# Patient Record
Sex: Female | Born: 1950 | Hispanic: No | Marital: Married | Smoking: Never smoker
Health system: Southern US, Community
[De-identification: ages and names within clinical notes are randomized; demographics above are authoritative.]

---

## 2002-06-17 ENCOUNTER — Encounter: Payer: Self-pay | Admitting: Internal Medicine

## 2002-06-17 ENCOUNTER — Ambulatory Visit (HOSPITAL_COMMUNITY): Admission: RE | Admit: 2002-06-17 | Discharge: 2002-06-17 | Payer: Self-pay | Admitting: Internal Medicine

## 2002-07-28 ENCOUNTER — Other Ambulatory Visit: Admission: RE | Admit: 2002-07-28 | Discharge: 2002-07-28 | Payer: Self-pay | Admitting: Family Medicine

## 2002-07-28 ENCOUNTER — Encounter: Admission: RE | Admit: 2002-07-28 | Discharge: 2002-07-28 | Payer: Self-pay | Admitting: *Deleted

## 2002-10-04 ENCOUNTER — Encounter: Payer: Self-pay | Admitting: Internal Medicine

## 2002-10-04 ENCOUNTER — Ambulatory Visit (HOSPITAL_COMMUNITY): Admission: RE | Admit: 2002-10-04 | Discharge: 2002-10-04 | Payer: Self-pay | Admitting: Internal Medicine

## 2003-03-02 ENCOUNTER — Emergency Department (HOSPITAL_COMMUNITY): Admission: EM | Admit: 2003-03-02 | Discharge: 2003-03-02 | Payer: Self-pay

## 2014-03-29 LAB — PROCEDURE REPORT - SCANNED: Pap: NEGATIVE

## 2014-10-14 ENCOUNTER — Emergency Department (HOSPITAL_COMMUNITY)
Admission: EM | Admit: 2014-10-14 | Discharge: 2014-10-14 | Disposition: A | Discharge status: Home / Self Care | Payer: Worker's Compensation | Attending: Emergency Medicine | Admitting: Emergency Medicine

## 2014-10-14 ENCOUNTER — Encounter (HOSPITAL_COMMUNITY): Payer: Self-pay

## 2014-10-14 DIAGNOSIS — Y998 Other external cause status: Secondary | ICD-10-CM | POA: Insufficient documentation

## 2014-10-14 DIAGNOSIS — S61219A Laceration without foreign body of unspecified finger without damage to nail, initial encounter: Secondary | ICD-10-CM

## 2014-10-14 DIAGNOSIS — Z23 Encounter for immunization: Secondary | ICD-10-CM | POA: Diagnosis not present

## 2014-10-14 DIAGNOSIS — Y92090 Kitchen in other non-institutional residence as the place of occurrence of the external cause: Secondary | ICD-10-CM | POA: Insufficient documentation

## 2014-10-14 DIAGNOSIS — Y9389 Activity, other specified: Secondary | ICD-10-CM | POA: Diagnosis not present

## 2014-10-14 DIAGNOSIS — W260XXA Contact with knife, initial encounter: Secondary | ICD-10-CM | POA: Insufficient documentation

## 2014-10-14 DIAGNOSIS — S61211A Laceration without foreign body of left index finger without damage to nail, initial encounter: Secondary | ICD-10-CM | POA: Diagnosis not present

## 2014-10-14 MED ORDER — ACETAMINOPHEN 325 MG PO TABS
650.0000 mg | ORAL_TABLET | Freq: Once | ORAL | Status: AC
  Administered 2014-10-14: 650 mg via ORAL
  Filled 2014-10-14: qty 2

## 2014-10-14 MED ORDER — TETANUS-DIPHTH-ACELL PERTUSSIS 5-2.5-18.5 LF-MCG/0.5 IM SUSP
0.5000 mL | Freq: Once | INTRAMUSCULAR | Status: AC
  Administered 2014-10-14: 0.5 mL via INTRAMUSCULAR
  Filled 2014-10-14: qty 0.5

## 2014-10-14 MED ORDER — "THROMBI-PAD 3""X3"" EX PADS"
1.0000 | MEDICATED_PAD | Freq: Once | CUTANEOUS | Status: DC
  Filled 2014-10-14: qty 1

## 2014-10-14 NOTE — Discharge Instructions (Signed)

## 2014-10-14 NOTE — ED Notes (Signed)
Patient cut the tip of left 1st finger with a knife. Patient bleeding upon arrival to the ED.

## 2014-10-14 NOTE — ED Provider Notes (Signed)
CSN: [id number]     Arrival date & time 10/14/14  1703 History   First MD Initiated Contact with Patient 10/14/14 2005     Chief Complaint  Patient presents with  . Finger Injury     (Consider location/radiation/quality/duration/timing/severity/associated sxs/prior Treatment) Patient is a 64 y.o. female presenting with hand pain. The history is provided by the patient. No language interpreter was used.  Hand Pain This is a new problem. The current episode started today. Pertinent negatives include no fever. Associated symptoms comments: She cut her left index finger with a knife while working in a kitchen earlier today. No other injury. Marland Kitchen.    History reviewed. No pertinent past medical history. History reviewed. No pertinent past surgical history. History reviewed. No pertinent family history. History  Substance Use Topics  . Smoking status: Never Smoker   . Smokeless tobacco: Not on file  . Alcohol Use: No   OB History    No data available     Review of Systems  Constitutional: Negative for fever.  Musculoskeletal:       See HPI.  Skin: Positive for wound.      Allergies  Review of patient's allergies indicates not on file.  Home Medications   Prior to Admission medications   Not on File   BP 161/91 mmHg  Pulse 72  Temp(Src) 98.3 F (36.8 C) (Oral)  Resp 16  SpO2 97% Physical Exam  Constitutional: She is oriented to person, place, and time. She appears well-developed and well-nourished.  Neck: Normal range of motion.  Pulmonary/Chest: Effort normal.  Neurological: She is alert and oriented to person, place, and time.  Skin: Skin is warm and dry.  Avulsion laceration to distal left index finger, actively bleeding. Nail intact. No swelling.    ED Course  Procedures (including critical care time) Labs Review Labs Reviewed - No data to display  Imaging Review No results found.   EKG Interpretation None      MDM   Final diagnoses:  None    1.  Finger laceration  Thrombi pad applied to wound after cleansing. Tetanus updated. Care instructions provided.    Arnoldo HookerShari A Jahdai Padovano, PA-C 10/14/14 2020  Toy BakerAnthony T Allen, MD 10/15/14 [phone]

## 2016-06-11 ENCOUNTER — Encounter: Payer: Self-pay | Admitting: *Deleted

## 2017-12-31 ENCOUNTER — Encounter (HOSPITAL_COMMUNITY): Payer: Self-pay

## 2017-12-31 ENCOUNTER — Other Ambulatory Visit: Payer: Self-pay

## 2017-12-31 ENCOUNTER — Emergency Department (HOSPITAL_COMMUNITY)
Admission: EM | Admit: 2017-12-31 | Discharge: 2018-01-01 | Disposition: A | Discharge status: Home / Self Care | Payer: Medicare Other | Attending: Emergency Medicine | Admitting: Emergency Medicine

## 2017-12-31 ENCOUNTER — Emergency Department (HOSPITAL_COMMUNITY): Payer: Medicare Other

## 2017-12-31 DIAGNOSIS — R51 Headache: Secondary | ICD-10-CM | POA: Diagnosis present

## 2017-12-31 DIAGNOSIS — I159 Secondary hypertension, unspecified: Secondary | ICD-10-CM | POA: Insufficient documentation

## 2017-12-31 DIAGNOSIS — Z79899 Other long term (current) drug therapy: Secondary | ICD-10-CM | POA: Diagnosis not present

## 2017-12-31 LAB — COMPREHENSIVE METABOLIC PANEL
ALK PHOS: 66 U/L (ref 38–126)
ALT: 56 U/L — AB (ref 14–54)
AST: 54 U/L — AB (ref 15–41)
Albumin: 4.6 g/dL (ref 3.5–5.0)
Anion gap: 11 (ref 5–15)
BUN: 13 mg/dL (ref 6–20)
CHLORIDE: 101 mmol/L (ref 101–111)
CO2: 29 mmol/L (ref 22–32)
Calcium: 9.9 mg/dL (ref 8.9–10.3)
Creatinine, Ser: 0.64 mg/dL (ref 0.44–1.00)
GFR calc Af Amer: >60 mL/min (ref >60)
GFR calc non Af Amer: >60 mL/min (ref >60)
Glucose, Bld: 119 mg/dL — ABNORMAL HIGH (ref 65–99)
Potassium: 3.4 mmol/L — ABNORMAL LOW (ref 3.5–5.1)
SODIUM: 141 mmol/L (ref 135–145)
Total Bilirubin: 0.8 mg/dL (ref 0.3–1.2)
Total Protein: 8.5 g/dL — ABNORMAL HIGH (ref 6.5–8.1)

## 2017-12-31 LAB — I-STAT CHEM 8, ED
BUN: 15 mg/dL (ref 6–20)
CHLORIDE: 99 mmol/L — AB (ref 101–111)
CREATININE: 0.6 mg/dL (ref 0.44–1.00)
Calcium, Ion: 1.15 mmol/L (ref 1.15–1.40)
Glucose, Bld: 116 mg/dL — ABNORMAL HIGH (ref 65–99)
HEMATOCRIT: 46 % (ref 36.0–46.0)
Hemoglobin: 15.6 g/dL — ABNORMAL HIGH (ref 12.0–15.0)
POTASSIUM: 3.5 mmol/L (ref 3.5–5.1)
Sodium: 141 mmol/L (ref 135–145)
TCO2: 32 mmol/L (ref 22–32)

## 2017-12-31 LAB — DIFFERENTIAL
BASOS ABS: 0 10*3/uL (ref 0.0–0.1)
BASOS PCT: 0 %
Eosinophils Absolute: 0.2 10*3/uL (ref 0.0–0.7)
Eosinophils Relative: 3 %
LYMPHS PCT: 56 %
Lymphs Abs: 2.8 10*3/uL (ref 0.7–4.0)
MONOS PCT: 6 %
Monocytes Absolute: 0.3 10*3/uL (ref 0.1–1.0)
NEUTROS ABS: 1.8 10*3/uL (ref 1.7–7.7)
Neutrophils Relative %: 35 %

## 2017-12-31 LAB — CBC
HEMATOCRIT: 44 % (ref 36.0–46.0)
Hemoglobin: 14.8 g/dL (ref 12.0–15.0)
MCH: 30.9 pg (ref 26.0–34.0)
MCHC: 33.6 g/dL (ref 30.0–36.0)
MCV: 91.9 fL (ref 78.0–100.0)
PLATELETS: 180 10*3/uL (ref 150–400)
RBC: 4.79 MIL/uL (ref 3.87–5.11)
RDW: 13.1 % (ref 11.5–15.5)
WBC: 5.1 10*3/uL (ref 4.0–10.5)

## 2017-12-31 LAB — PROTIME-INR
INR: 0.88
Prothrombin Time: 11.9 seconds (ref 11.4–15.2)

## 2017-12-31 LAB — I-STAT TROPONIN, ED: Troponin i, poc: 0.01 ng/mL (ref 0.00–0.08)

## 2017-12-31 LAB — APTT: APTT: 30 s (ref 24–36)

## 2017-12-31 LAB — CBG MONITORING, ED: Glucose-Capillary: 131 mg/dL — ABNORMAL HIGH (ref 65–99)

## 2017-12-31 MED ORDER — LABETALOL HCL 5 MG/ML IV SOLN
10.0000 mg | INTRAVENOUS | Status: AC | PRN
  Administered 2017-12-31 (×6): 10 mg via INTRAVENOUS
  Filled 2017-12-31 (×3): qty 4

## 2017-12-31 MED ORDER — ASPIRIN 81 MG PO CHEW
324.0000 mg | CHEWABLE_TABLET | Freq: Once | ORAL | Status: AC
  Administered 2017-12-31: 324 mg via ORAL
  Filled 2017-12-31: qty 4

## 2017-12-31 MED ORDER — AMLODIPINE BESYLATE 5 MG PO TABS
5.0000 mg | ORAL_TABLET | Freq: Once | ORAL | Status: AC
  Administered 2017-12-31: 5 mg via ORAL
  Filled 2017-12-31: qty 1

## 2017-12-31 NOTE — ED Notes (Signed)
Bed: WA23 Expected date:  Expected time:  Means of arrival:  Comments: Triage 8 

## 2017-12-31 NOTE — Consult Note (Signed)
   [facility] Tele[facility]urology Consult Services  Impression: 67 y/o F h/o HTN, HLD -here with acute onset L sided HA, facial numbness -slurred speech, confusion -dizzine and gait imbalance -SBP 211/97 -last normal 845 pm tonight  -CT head wnl -NIHSS 0 no focal motor or sensory deficits   Not a tpa candidate due to: low NIHSS non disabling sx's  Symptoms (not) consistent with LVO therefore thrombectomy not indicated.  Differential Diagnosis:   1. Cardioembolic stroke  2. Small vessel disease/lacune  3. Thromboembolic, artery-to-artery mechanism  4. Hypercoagulable state-related infarct  5. Transient ischemic attack  6. Thrombotic mechanism, large artery disease   Comments:   [facility] contacted: 931 pm [facility] at bedside:  933 pm NIHSS assessment time:  945 pm  Recommendations:  -admit for stroke eval vs HTN urgency -ASA 325 now -IV fluids with NS -dysphagia screen -dvt prophy is ok    Inpatient neurology consultation Inpatient stroke evaluation as per Neurology/ Internal Medicine Discussed with ED MD Please call with questions      Medical Decision Making:  - Extensive number of diagnosis or management options are considered above.   - Extensive amount of complex data reviewed.   - High risk of complication and/or morbidity or mortality are associated with differential diagnostic considerations above.  - There may be Uncertain outcome and increased probability of prolonged functional impairment or high probability of severe prolonged functional impairment associated with some of these differential diagnosis.  Medical Data Reviewed:  1.Data reviewed include clinical labs, radiology,  Medical Tests;   2.Tests results discussed w/performing or interpreting physician;   3.Obtaining/reviewing old medical records;  4.Obtaining case history from another source;  5.Independent review of image, tracing or specimen.    Patient was informed the Neurology  Consult would happen via telehealth (remote video) and consented to receiving care in this manner.

## 2017-12-31 NOTE — ED Notes (Signed)
Code Stroke @ 21:26

## 2017-12-31 NOTE — ED Notes (Signed)
Effie ShyWentz MD updated on pt condition. He advised no more IV medications at this time.

## 2017-12-31 NOTE — ED Triage Notes (Signed)
Pt complaining of severe headache, confusion and slurred speech that started about 45 mins ago A&Ox4. MD made aware. Teleneuro cart in room.

## 2017-12-31 NOTE — ED Provider Notes (Signed)
[facility]-EMERGENCY DEPT Provider Note   CSN: [id number] Arrival date & time: 12/31/17  2118     History   Chief Complaint Chief Complaint  Patient presents with  . Headache    HPI Margaret Edwards is a 67 y.o. female.  She presents for evaluation of headache which started about 45 minutes ago, and a sensation that she is tired.  She also feels like she is having trouble talking.  When I asked her if she had vision changes she denied them.  She denies prior similar problem.  She is here with family members.  She denies other recent illnesses.  There are no other known modifying factors.  HPI  History reviewed. No pertinent past medical history.  There are no active problems to display for this patient.   History reviewed. No pertinent surgical history.   OB History   None      Home Medications    Prior to Admission medications   Medication Sig Start Date End Date Taking? Authorizing Provider  benazepril-hydrochlorthiazide (LOTENSIN HCT) 20-25 MG tablet Take 1 tablet by mouth daily. 10/15/17  Yes [provider]  cholecalciferol (VITAMIN D) 1000 units tablet Take 1,000 Units by mouth daily.   Yes [provider]  Multiple Vitamin (MULTIVITAMIN WITH MINERALS) TABS tablet Take 1 tablet by mouth daily.   Yes [provider]  Omega-3 Fatty Acids (FISH OIL PO) Take 1 tablet by mouth daily.   Yes [provider]  Red Yeast Rice Extract (RED YEAST RICE PO) Take 1 tablet by mouth daily.   Yes [provider]  amLODipine (NORVASC) 5 MG tablet Take 1 tablet (5 mg total) by mouth daily. 01/01/18   Mancel Bale, MD    Family History History reviewed. No pertinent family history.  Social History Social History   Tobacco Use  . Smoking status: Never Smoker  Substance Use Topics  . Alcohol use: No  . Drug use: No     Allergies   Patient has no known allergies.   Review of Systems Review of Systems  All  other systems reviewed and are negative.    Physical Exam Updated Vital Signs BP (!) 170/90   Pulse 71   Temp 98.1 F (36.7 C)   Resp 15   SpO2 98%   Physical Exam  Constitutional: She is oriented to person, place, and time. She appears well-developed and well-nourished. She appears ill (She is uncomfortable).  HENT:  Head: Normocephalic and atraumatic.  Eyes: Pupils are equal, round, and reactive to light. Conjunctivae and EOM are normal.  Neck: Normal range of motion and phonation normal. Neck supple.  Cardiovascular: Normal rate and regular rhythm.  Pulmonary/Chest: Effort normal and breath sounds normal. No respiratory distress. She exhibits no tenderness.  Abdominal: Soft. She exhibits no distension. There is no tenderness. There is no guarding.  Musculoskeletal: Normal range of motion.  Neurological: She is alert and oriented to person, place, and time. She exhibits normal muscle tone.  No dysarthria, or aphasia.  No frank confusion.  Mild left lateral nystagmus, left eye only.  Nystagmus is horizontal.  Normal strength arms legs bilaterally.  No pronator drift.  Skin: Skin is warm and dry.  Psychiatric: She has a normal mood and affect. Her behavior is normal. Judgment and thought content normal. Her mood appears not anxious. She is not agitated.  Nursing note and vitals reviewed.    ED Treatments / Results  Labs (all labs ordered are listed, but  only abnormal results are displayed) Labs Reviewed  COMPREHENSIVE METABOLIC PANEL - Abnormal; Notable for the following components:      Result Value   Potassium 3.4 (*)    Glucose, Bld 119 (*)    Total Protein 8.5 (*)    AST 54 (*)    ALT 56 (*)    All other components within normal limits  CBG MONITORING, ED - Abnormal; Notable for the following components:   Glucose-Capillary 131 (*)    All other components within normal limits  I-STAT CHEM 8, ED - Abnormal; Notable for the following components:   Chloride 99 (*)     Glucose, Bld 116 (*)    Hemoglobin 15.6 (*)    All other components within normal limits  PROTIME-INR  APTT  CBC  DIFFERENTIAL  I-STAT TROPONIN, ED    EKG EKG Interpretation  Date/Time:  Thursday December 31 2017 22:08:15 EDT Ventricular Rate:  82 PR Interval:    QRS Duration: 77 QT Interval:  392 QTC Calculation: 458 R Axis:   51 Text Interpretation:  Sinus rhythm Probable left atrial enlargement No old tracing to compare Confirmed by Mancel Bale [phone]) on 12/31/2017 10:19:45 PM Also confirmed by Mancel Bale [phone]), editor Elita Quick ([id number])  on 01/01/2018 7:53:49 AM   Radiology Ct Head Code Stroke Wo Contrast  Result Date: 12/31/2017 CLINICAL DATA:  Code stroke. 67 y/o F; abnormal left-sided facial sensation and confusion with headache. EXAM: CT HEAD WITHOUT CONTRAST TECHNIQUE: Contiguous axial images were obtained from the base of the skull through the vertex without intravenous contrast. COMPARISON:  None. FINDINGS: Brain: No evidence of acute infarction, hemorrhage, hydrocephalus, extra-axial collection or mass lesion/mass effect. Partially empty sella turcica. Mild chronic microvascular ischemic changes and parenchymal volume loss of the brain. Vascular: Calcific atherosclerosis of carotid siphons. No hyperdense vessel identified. Skull: Normal. Negative for fracture or focal lesion. Sinuses/Orbits: No acute finding. Other: None. ASPECTS [facility] Stroke Program Early CT Score) - Ganglionic level infarction (caudate, lentiform nuclei, internal capsule, insula, M1-M3 cortex): 7 - Supraganglionic infarction (M4-M6 cortex): 3 Total score (0-10 with 10 being normal): 10 IMPRESSION: 1. No acute intracranial abnormality identified. 2. ASPECTS is 10 3. Mild chronic microvascular ischemic changes and parenchymal volume loss of the brain. Electronically Signed   By: Mitzi Hansen M.D.   On: 12/31/2017 21:52    Procedures .Critical Care Performed by: Mancel Bale,  MD Authorized by: Mancel Bale, MD   Critical care provider statement:    Critical care time (minutes):  35   Critical care start time:  12/31/2017 9:34 PM   Critical care end time:  12/31/2017 11:35 AM   Critical care time was exclusive of:  Separately billable procedures and treating other patients   Critical care was necessary to treat or prevent imminent or life-threatening deterioration of the following conditions:  Circulatory failure   Critical care was time spent personally by me on the following activities:  Blood draw for specimens, development of treatment plan with patient or surrogate, discussions with consultants, evaluation of patient's response to treatment, examination of patient, obtaining history from patient or surrogate, ordering and performing treatments and interventions, ordering and review of laboratory studies, pulse oximetry, re-evaluation of patient's condition, review of old charts and ordering and review of radiographic studies   (including critical care time)  Medications Ordered in ED Medications  aspirin chewable tablet 324 mg (324 mg Oral Given 12/31/17 2226)  labetalol (NORMODYNE,TRANDATE) injection 10 mg (10 mg Intravenous Given 12/31/17 2317)  amLODipine (NORVASC) tablet 5 mg (5 mg Oral Given 12/31/17 2354)     Initial Impression / Assessment and Plan / ED Course  I have reviewed the triage vital signs and the nursing notes.  Pertinent labs & imaging results that were available during my care of the patient were reviewed by me and considered in my medical decision making (see chart for details).  Clinical Course as of Jan 01 1001  Thu Dec 31, 2017  2208 Case discussed with on-call to neurology who states that the patient's NIH score is 0, and that she is not a code stroke candidate at this time.  He recommends aspirin and blood pressure control.   [EW]  2208 At this time the patient is comfortable has no further complaints.   [EW]  2325 At this time  patient feels better.  Mean arterial pressure improved from 118 to 101 at this time.  Patient denies headache, blurred vision, nausea, vomiting, weakness or dizziness at this time.  Norvasc ordered.   [EW]    Clinical Course User Index [EW] Mancel BaleWentz, Rik Wadel, MD     Patient Vitals for the past 24 hrs:  BP Temp Temp src Pulse Resp SpO2  01/01/18 0005 (!) 170/90 - - 71 15 98 %  12/31/17 2345 (!) 161/91 - - 76 19 96 %  12/31/17 2320 (!) 146/85 - - 70 13 97 %  12/31/17 2310 (!) 154/84 - - 73 (!) 36 94 %  12/31/17 2300 (!) 162/81 - - 70 17 98 %  12/31/17 2255 (!) 154/86 - - 75 15 97 %  12/31/17 2250 (!) 159/85 - - 75 (!) 21 98 %  12/31/17 2245 (!) 161/124 - - - 14 -  12/31/17 2240 (!) 153/83 - - 75 (!) 22 95 %  12/31/17 2230 (!) 172/91 - - 75 17 100 %  12/31/17 2225 (!) 173/93 - - 74 19 97 %  12/31/17 2222 - 98.1 F (36.7 C) - - - -  12/31/17 2207 (!) 183/93 - - 79 13 99 %  12/31/17 2130 (!) 211/97 98 F (36.7 C) Oral 83 18 96 %   At D/C- Reevaluation with update and discussion. After initial assessment and treatment, an updated evaluation reveals she is comfortable, denies headache blurred vision or dizziness.  Findings discussed with patient and family members, all questions were answered. Mancel BaleElliott Sachin Ferencz   MDM-hypertensive urgency, with neurologic symptoms controlled following treatment of elevated blood pressure.  Urgent evaluation for stroke was negative.  Patient's symptoms resolved after treatment of high blood pressure.  Doubt CVA, ongoing hemodynamic instability, metabolic instability or intracranial lesion.  Nursing Notes Reviewed/ Care Coordinated Applicable Imaging Reviewed Interpretation of Laboratory Data incorporated into ED treatment  The patient appears reasonably screened and/or stabilized for discharge and I doubt any other medical condition or other [facility] requiring further screening, evaluation, or treatment in the ED at this time prior to discharge.  Plan: Home  Medications-continue current home medications, begin Norvasc prescription tomorrow; Home Treatments-gradually advance activity; return here if the recommended treatment, does not improve the symptoms; Recommended follow up-PCP follow-up 1 week for blood pressure check    Final Clinical Impressions(s) / ED Diagnoses   Final diagnoses:  Secondary hypertension    ED Discharge Orders        Ordered    amLODipine (NORVASC) 5 MG tablet  Daily     01/01/18 0006       Mancel BaleWentz, Danuta Huseman, MD 01/01/18 1005

## 2018-01-01 MED ORDER — AMLODIPINE BESYLATE 5 MG PO TABS
5.0000 mg | ORAL_TABLET | Freq: Every day | ORAL | 0 refills | Status: AC
Start: 2018-01-01 — End: ?

## 2018-01-01 MED ORDER — AMLODIPINE BESYLATE 5 MG PO TABS: 5.0000 mg | ORAL_TABLET | Freq: Once | ORAL | Status: DC

## 2018-01-01 NOTE — Discharge Instructions (Addendum)
Start the blood pressure prescription tomorrow.  Follow-up with your primary care doctor for checkup in 1-2 weeks, and as needed.  Return here, if needed, for problems.

## 2018-01-17 ENCOUNTER — Encounter (HOSPITAL_COMMUNITY): Payer: Self-pay | Admitting: Emergency Medicine

## 2018-01-17 ENCOUNTER — Emergency Department (HOSPITAL_COMMUNITY)
Admission: EM | Admit: 2018-01-17 | Discharge: 2018-01-18 | Disposition: A | Discharge status: Home / Self Care | Payer: Medicare Other | Attending: Emergency Medicine | Admitting: Emergency Medicine

## 2018-01-17 DIAGNOSIS — I10 Essential (primary) hypertension: Secondary | ICD-10-CM | POA: Diagnosis present

## 2018-01-17 DIAGNOSIS — E876 Hypokalemia: Secondary | ICD-10-CM

## 2018-01-17 DIAGNOSIS — Z79899 Other long term (current) drug therapy: Secondary | ICD-10-CM | POA: Insufficient documentation

## 2018-01-17 LAB — URINALYSIS, ROUTINE W REFLEX MICROSCOPIC
Bacteria, UA: NONE SEEN
Bilirubin Urine: NEGATIVE
GLUCOSE, UA: NEGATIVE mg/dL
Ketones, ur: NEGATIVE mg/dL
Leukocytes, UA: NEGATIVE
Nitrite: NEGATIVE
PH: 8 (ref 5.0–8.0)
Protein, ur: NEGATIVE mg/dL
SPECIFIC GRAVITY, URINE: 1.003 — AB (ref 1.005–1.030)

## 2018-01-17 LAB — CBC WITH DIFFERENTIAL/PLATELET
BASOS PCT: 0 %
Basophils Absolute: 0 10*3/uL (ref 0.0–0.1)
EOS ABS: 0.2 10*3/uL (ref 0.0–0.7)
EOS PCT: 3 %
HCT: 42.1 % (ref 36.0–46.0)
Hemoglobin: 14.2 g/dL (ref 12.0–15.0)
LYMPHS ABS: 2.6 10*3/uL (ref 0.7–4.0)
Lymphocytes Relative: 52 %
MCH: 30.9 pg (ref 26.0–34.0)
MCHC: 33.7 g/dL (ref 30.0–36.0)
MCV: 91.7 fL (ref 78.0–100.0)
Monocytes Absolute: 0.3 10*3/uL (ref 0.1–1.0)
Monocytes Relative: 6 %
NEUTROS PCT: 39 %
Neutro Abs: 2 10*3/uL (ref 1.7–7.7)
PLATELETS: 188 10*3/uL (ref 150–400)
RBC: 4.59 MIL/uL (ref 3.87–5.11)
RDW: 13 % (ref 11.5–15.5)
WBC: 5 10*3/uL (ref 4.0–10.5)

## 2018-01-17 LAB — I-STAT CHEM 8, ED
BUN: 15 mg/dL (ref 6–20)
CALCIUM ION: 1.1 mmol/L — AB (ref 1.15–1.40)
Chloride: 101 mmol/L (ref 101–111)
Creatinine, Ser: 0.6 mg/dL (ref 0.44–1.00)
Glucose, Bld: 116 mg/dL — ABNORMAL HIGH (ref 65–99)
HEMATOCRIT: 43 % (ref 36.0–46.0)
Hemoglobin: 14.6 g/dL (ref 12.0–15.0)
Potassium: 2.8 mmol/L — ABNORMAL LOW (ref 3.5–5.1)
SODIUM: 141 mmol/L (ref 135–145)
TCO2: 27 mmol/L (ref 22–32)

## 2018-01-17 LAB — I-STAT TROPONIN, ED: TROPONIN I, POC: 0 ng/mL (ref 0.00–0.08)

## 2018-01-17 MED ORDER — POTASSIUM CHLORIDE CRYS ER 20 MEQ PO TBCR
80.0000 meq | EXTENDED_RELEASE_TABLET | Freq: Once | ORAL | Status: AC
  Administered 2018-01-18: 80 meq via ORAL
  Filled 2018-01-17: qty 4

## 2018-01-17 NOTE — ED Notes (Signed)
Bed: WA09 Expected date: 01/17/18 Expected time: 10:49 PM Means of arrival:  Comments: HTN

## 2018-01-17 NOTE — ED Notes (Signed)
EKG given to EDP,Palumbo,MD., for review. 

## 2018-01-17 NOTE — ED Triage Notes (Addendum)
Per EMS , pt. From home with complaint of hypertension , initial BP was 244/134 mmhg, denied headache nor dizziness nor chest pain . Pt. Alert and oriented x4. Pt. Was here two weeks ago for the same problem ,stated that shes been taking her BP med regularly. Pt. Took extra dose of her BP med prior to calling EMS at 0900pm this evening.

## 2018-01-17 NOTE — ED Provider Notes (Addendum)
[facility]-EMERGENCY DEPT Provider Note   CSN: [id number] Arrival date & time: 01/17/18  2252     History   Chief Complaint Chief Complaint  Patient presents with  . Hypertension    HPI Margaret Edwards is a 67 y.o. female.  The history is provided by the patient.  Hypertension  This is a chronic problem. The current episode started more than 1 week ago. The problem occurs constantly. The problem has not changed since onset.Pertinent negatives include no chest pain, no abdominal pain, no headaches and no shortness of breath. Nothing aggravates the symptoms. Nothing relieves the symptoms. Treatments tried: additional doses of her medication. The treatment provided significant relief.    History reviewed. No pertinent past medical history.  There are no active problems to display for this patient.   History reviewed. No pertinent surgical history.   OB History   None      Home Medications    Prior to Admission medications   Medication Sig Start Date End Date Taking? Authorizing Provider  amLODipine (NORVASC) 5 MG tablet Take 1 tablet (5 mg total) by mouth daily. 01/01/18   Mancel Bale, MD  benazepril-hydrochlorthiazide (LOTENSIN HCT) 20-25 MG tablet Take 1 tablet by mouth daily. 10/15/17   [provider]  cholecalciferol (VITAMIN D) 1000 units tablet Take 1,000 Units by mouth daily.    [provider]  Multiple Vitamin (MULTIVITAMIN WITH MINERALS) TABS tablet Take 1 tablet by mouth daily.    [provider]  Omega-3 Fatty Acids (FISH OIL PO) Take 1 tablet by mouth daily.    [provider]  Red Yeast Rice Extract (RED YEAST RICE PO) Take 1 tablet by mouth daily.    [provider]    Family History History reviewed. No pertinent family history.  Social History Social History   Tobacco Use  . Smoking status: Never Smoker  Substance Use Topics  . Alcohol use: No  . Drug use: No     Allergies     Patient has no known allergies.   Review of Systems Review of Systems  Constitutional: Negative for diaphoresis and fever.  Eyes: Negative for visual disturbance.  Respiratory: Negative for shortness of breath.   Cardiovascular: Negative for chest pain, palpitations and leg swelling.  Gastrointestinal: Negative for abdominal pain and nausea.  Genitourinary: Positive for frequency.  Musculoskeletal: Negative for neck pain.  Neurological: Negative for dizziness, seizures, syncope, facial asymmetry, speech difficulty, weakness, light-headedness, numbness and headaches.  All other systems reviewed and are negative.    Physical Exam Updated Vital Signs BP (!) 186/94 (BP Location: Right Arm)   Pulse 78   Temp 97.9 F (36.6 C) (Oral)   Resp 18   SpO2 99%   Physical Exam  Constitutional: She is oriented to person, place, and time. She appears well-developed and well-nourished. No distress.  HENT:  Head: Normocephalic and atraumatic.  Mouth/Throat: No oropharyngeal exudate.  Eyes: Pupils are equal, round, and reactive to light. Conjunctivae and EOM are normal.  Neck: Normal range of motion. Neck supple.  Cardiovascular: Normal rate, regular rhythm, normal heart sounds and intact distal pulses.  Pulmonary/Chest: Effort normal and breath sounds normal. No stridor. She has no wheezes. She has no rales.  Abdominal: Soft. Bowel sounds are normal. She exhibits no mass. There is no tenderness. There is no rebound and no guarding.  Neurological: She is alert and oriented to person, place, and time. She displays normal reflexes. No cranial nerve deficit.  Skin:  Skin is warm and dry. Capillary refill takes less than 2 seconds.  Psychiatric: She has a normal mood and affect.  Nursing note and vitals reviewed.    ED Treatments / Results  Labs (all labs ordered are listed, but only abnormal results are displayed) Results for orders placed or performed during the hospital encounter of  01/17/18  I-stat chem 8, ed  Result Value Ref Range   Sodium 141 135 - 145 mmol/L   Potassium 2.8 (L) 3.5 - 5.1 mmol/L   Chloride 101 101 - 111 mmol/L   BUN 15 6 - 20 mg/dL   Creatinine, Ser 1.47 0.44 - 1.00 mg/dL   Glucose, Bld 829 (H) 65 - 99 mg/dL   Calcium, Ion 5.62 (L) 1.15 - 1.40 mmol/L   TCO2 27 22 - 32 mmol/L   Hemoglobin 14.6 12.0 - 15.0 g/dL   HCT 13.0 86.5 - 78.4 %  I-stat troponin, ED  Result Value Ref Range   Troponin i, poc 0.00 0.00 - 0.08 ng/mL   Comment 3           Ct Head Code Stroke Wo Contrast  Result Date: 12/31/2017 CLINICAL DATA:  Code stroke. 67 y/o F; abnormal left-sided facial sensation and confusion with headache. EXAM: CT HEAD WITHOUT CONTRAST TECHNIQUE: Contiguous axial images were obtained from the base of the skull through the vertex without intravenous contrast. COMPARISON:  None. FINDINGS: Brain: No evidence of acute infarction, hemorrhage, hydrocephalus, extra-axial collection or mass lesion/mass effect. Partially empty sella turcica. Mild chronic microvascular ischemic changes and parenchymal volume loss of the brain. Vascular: Calcific atherosclerosis of carotid siphons. No hyperdense vessel identified. Skull: Normal. Negative for fracture or focal lesion. Sinuses/Orbits: No acute finding. Other: None. ASPECTS [facility] Stroke Program Early CT Score) - Ganglionic level infarction (caudate, lentiform nuclei, internal capsule, insula, M1-M3 cortex): 7 - Supraganglionic infarction (M4-M6 cortex): 3 Total score (0-10 with 10 being normal): 10 IMPRESSION: 1. No acute intracranial abnormality identified. 2. ASPECTS is 10 3. Mild chronic microvascular ischemic changes and parenchymal volume loss of the brain. Electronically Signed   By: Mitzi Hansen M.D.   On: 12/31/2017 21:52    EKG  EKG Interpretation  Date/Time:  Sunday Jan 17 2018 23:22:47 EDT Ventricular Rate:  75 PR Interval:    QRS Duration: 86 QT Interval:  402 QTC Calculation: 449 R  Axis:   50 Text Interpretation:  Sinus rhythm Atrial premature complex Confirmed by Nicanor Alcon, Tishawn Friedhoff ([id number]) on 01/17/2018 11:30:31 PM        Procedures Procedures (including critical care time)  Medications Ordered in  Medications  potassium chloride SA (K-DUR,KLOR-CON) CR tablet 80 mEq (has no administration in time range)     Final Clinical Impressions(s) / ED Diagnoses  Treated for hypokalemia and RX for same.    Follow up with your family doctor to adjust your medications.  BP already improved in the ED.    Return for weakness, numbness, changes in vision or speech, fevers >100.4 unrelieved by medication, shortness of breath, intractable vomiting, or diarrhea, abdominal pain, Inability to tolerate liquids or food, cough, altered mental status or any concerns. No signs of systemic illness or infection. The patient is nontoxic-appearing on exam and vital signs are within normal limits.   I have reviewed the triage vital signs and the nursing notes. Pertinent labs &imaging results that were available during my care of the patient were reviewed by me and considered in my medical decision making (see chart for details).  After  history, exam, and medical workup I feel the patient has been appropriately medically screened and is safe for discharge home. Pertinent diagnoses were discussed with the patient. Patient was given return precautions.    Ayse Mccartin, MD 01/17/18 2345    Mandy Peeks, MD 01/17/18 2351

## 2018-01-18 DIAGNOSIS — I10 Essential (primary) hypertension: Secondary | ICD-10-CM | POA: Diagnosis not present

## 2018-01-18 MED ORDER — POTASSIUM CHLORIDE ER 20 MEQ PO TBCR: 10.0000 meq | EXTENDED_RELEASE_TABLET | Freq: Two times a day (BID) | ORAL | 0 refills | Status: AC

## 2018-10-10 IMAGING — CT CT HEAD CODE STROKE
3 series · 15 of 47 positions shown, 18 images · non-contrast
Comparison: None.

CLINICAL DATA: Code stroke. 66 y/o F; abnormal left-sided facial
sensation and confusion with headache.

EXAM:
CT HEAD WITHOUT CONTRAST
TECHNIQUE: Contiguous axial images were obtained from the base of the skull
through the vertex without intravenous contrast.

[Series 2: head wo · axial · 0.47mm/px · z∈[+1645,+1770]mm · 9 of 30 slices shown, 12 images]
[im 3/30  brain]
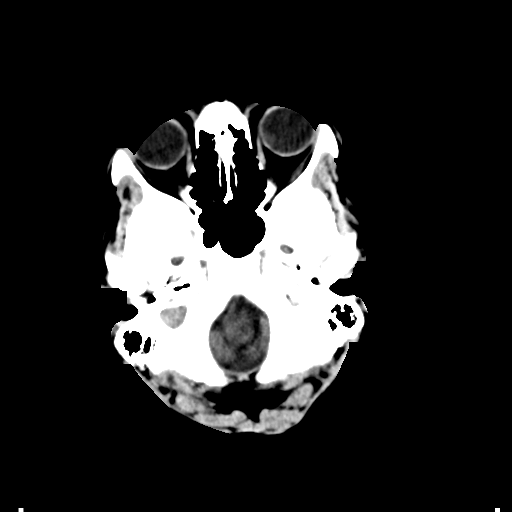
[im 3/30  bone]
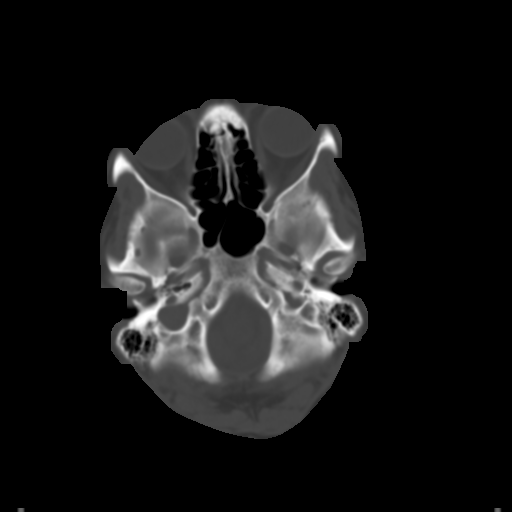
[im 6/30  brain]
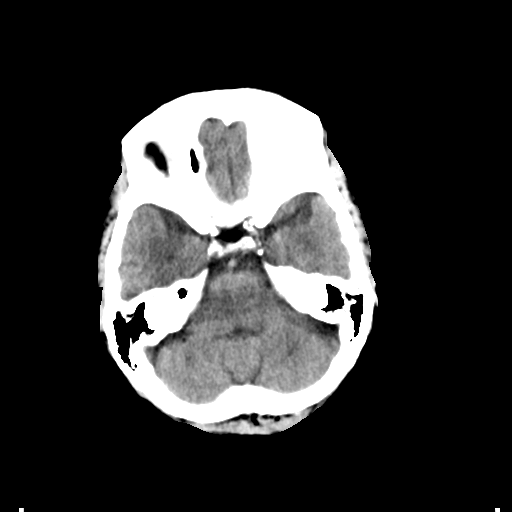
[im 9/30  brain]
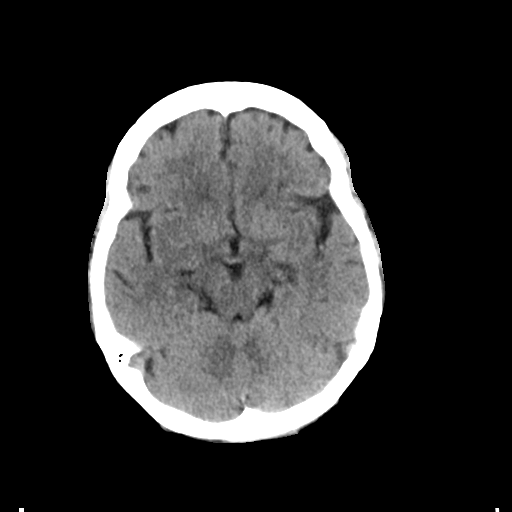
[im 12/30  brain]
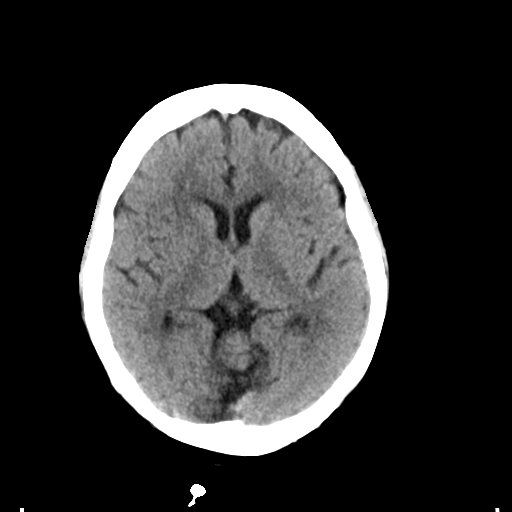
[im 16/30  brain]
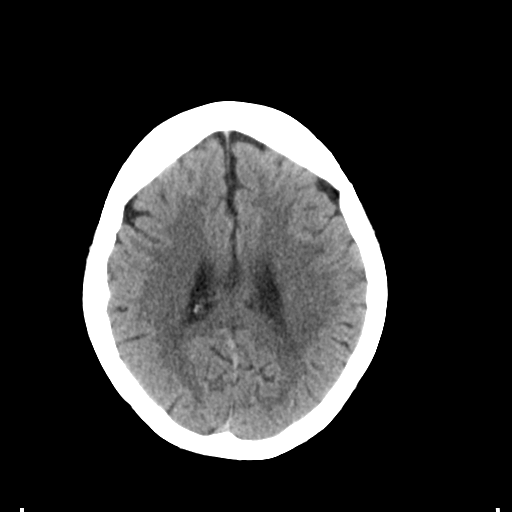
[im 16/30  bone]
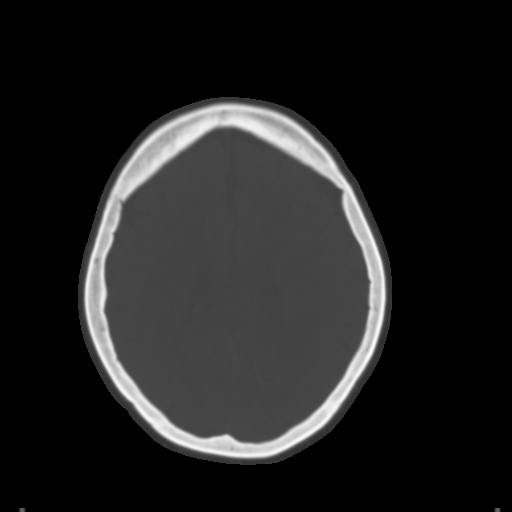
[im 19/30  brain]
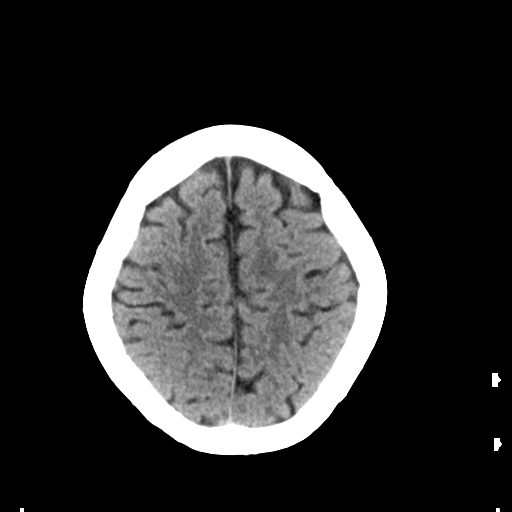
[im 22/30  brain]
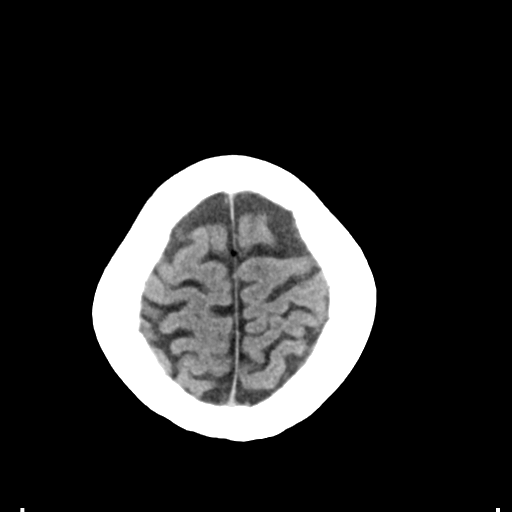
[im 25/30  brain]
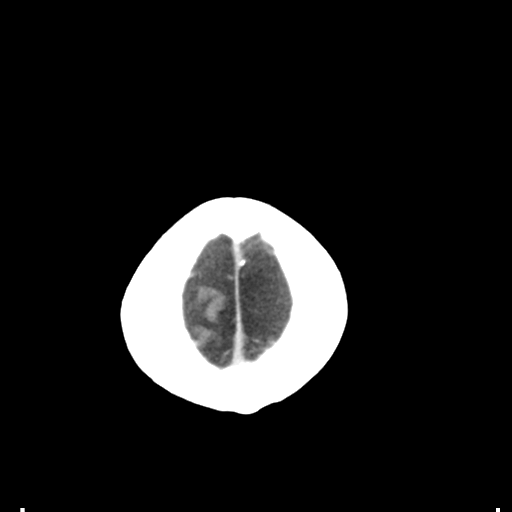
[im 28/30  brain]
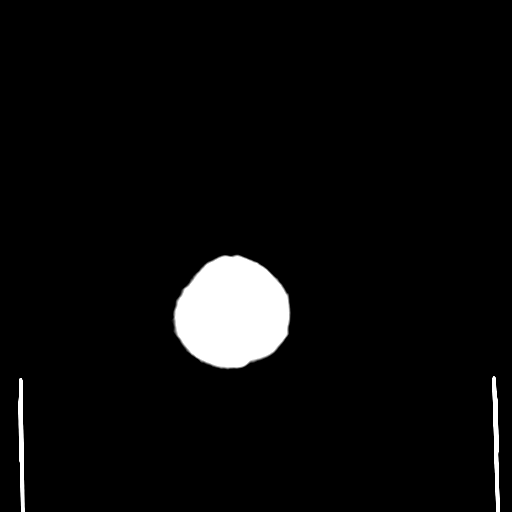
[im 28/30  bone]
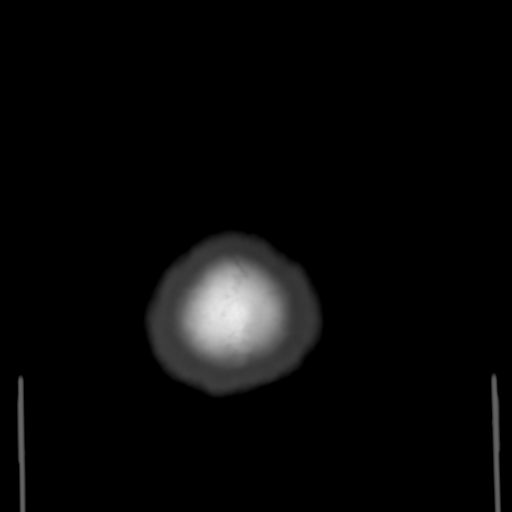

[Series 4: coronal soft tissue · coronal · 0.29mm/px · 3 of 64 slices shown]
[im 22/64  brain]
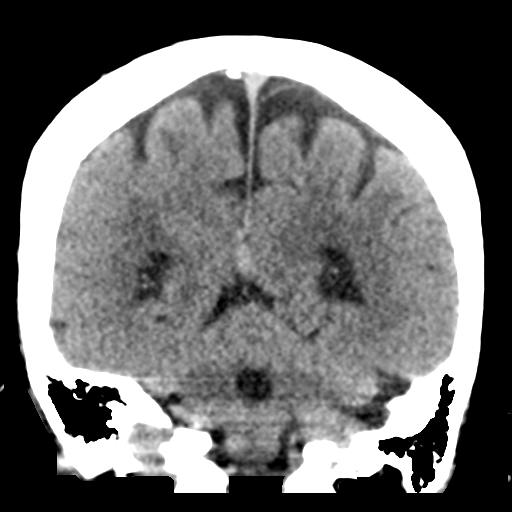
[im 29/64  brain]
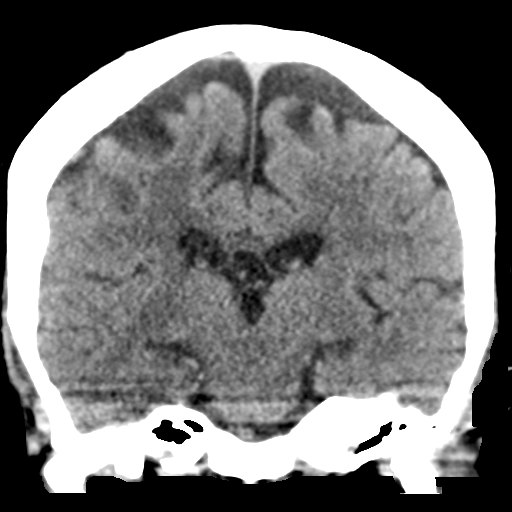
[im 36/64  brain]
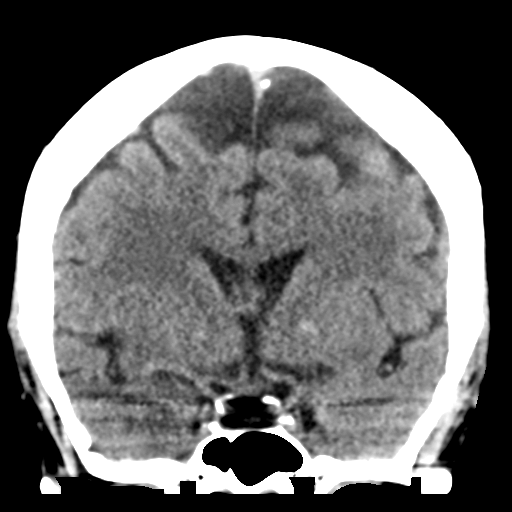

[Series 5: sagittal soft tissue · sagittal · 0.29mm/px · 3 of 51 slices shown]
[im 17/51  brain]
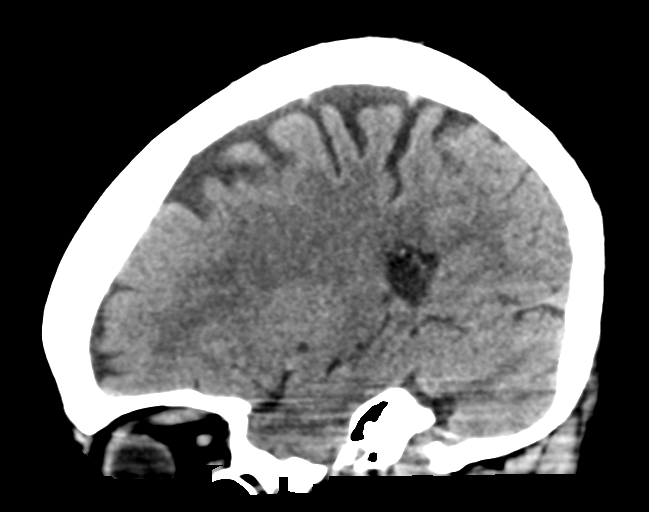
[im 26/51  brain]
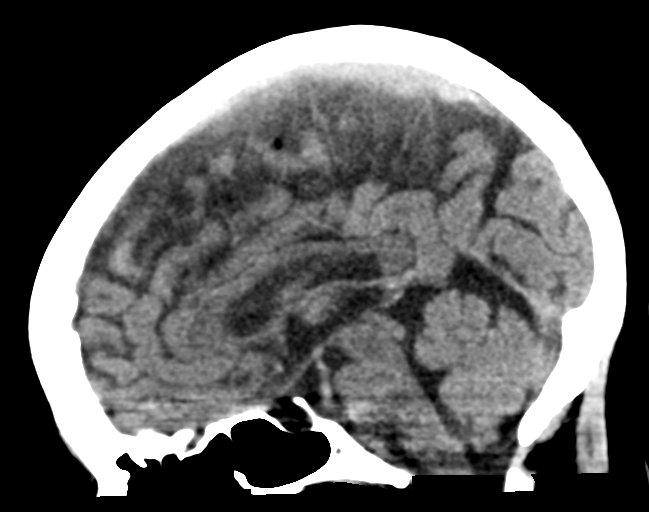
[im 34/51  brain]
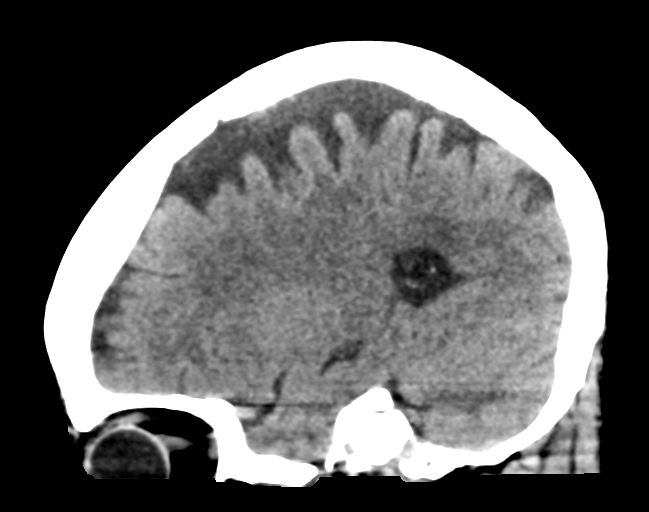

[15 of 47 positions shown; findings below may reference images not displayed]

FINDINGS: Brain: No evidence of acute infarction, hemorrhage, hydrocephalus,
extra-axial collection or mass lesion/mass effect. Partially empty
sella turcica. Mild chronic microvascular ischemic changes and
parenchymal volume loss of the brain.

Vascular: Calcific atherosclerosis of carotid siphons. No hyperdense
vessel identified.

Skull: Normal. Negative for fracture or focal lesion.

Sinuses/Orbits: No acute finding.

Other: None.

ASPECTS ([place] Stroke Program Early CT Score)

- Ganglionic level infarction (caudate, lentiform nuclei, internal
capsule, insula, M1-M3 cortex): 7

- Supraganglionic infarction (M4-M6 cortex): 3

Total score (0-10 with 10 being normal): 10
IMPRESSION: 1. No acute intracranial abnormality identified.
2. ASPECTS is 10
3. Mild chronic microvascular ischemic changes and parenchymal
volume loss of the brain.

By: Birant Kakar M.D.

## 2019-12-22 ENCOUNTER — Ambulatory Visit: Payer: Self-pay

## 2020-01-05 ENCOUNTER — Ambulatory Visit: Payer: Medicaid Other | Attending: Internal Medicine

## 2020-01-05 DIAGNOSIS — Z23 Encounter for immunization: Secondary | ICD-10-CM

## 2020-01-05 NOTE — Progress Notes (Signed)
   [facility]  Name:  Margaret Edwards    MRN: [id number] DOB: Feb 01, 1951  01/05/2020  [name] was observed post Covid-19 immunization for 15 minutes without incident. She was provided with Vaccine Information Sheet and instruction to access the V-Safe system.   [name] was instructed to call 911 with any severe reactions post vaccine: Marland Kitchen Difficulty breathing  . Swelling of face and throat  . A fast heartbeat  . A bad rash all over body  . Dizziness and weakness   Immunizations Administered    Name Date Dose VIS Date Route   Pfizer COVID-19 Vaccine 01/05/2020 11:19 AM 0.3 mL 11/09/2018 Intramuscular   Manufacturer: [employer], [employer]   Lot: [id number]   NDC: [address]-0

## 2020-01-30 ENCOUNTER — Ambulatory Visit: Payer: Medicaid Other | Attending: Internal Medicine
